# Patient Record
Sex: Female | Born: 2005 | Race: White | Hispanic: No | Marital: Single | Smoking: Never smoker
Health system: Southern US, Community
[De-identification: ages and names within clinical notes are randomized; demographics above are authoritative.]

## PROBLEM LIST (undated history)

## (undated) DIAGNOSIS — N3020 Other chronic cystitis without hematuria: Secondary | ICD-10-CM

---
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
## 2024-07-28 ENCOUNTER — Emergency Department (HOSPITAL_COMMUNITY)

## 2024-07-28 ENCOUNTER — Encounter (HOSPITAL_COMMUNITY): Payer: Self-pay | Admitting: Emergency Medicine

## 2024-07-28 ENCOUNTER — Inpatient Hospital Stay (HOSPITAL_COMMUNITY)
Admission: EM | Admit: 2024-07-28 | Discharge: 2024-07-30 | DRG: 872 | Disposition: A | Discharge status: Home / Self Care | Attending: Internal Medicine | Admitting: Internal Medicine

## 2024-07-28 ENCOUNTER — Other Ambulatory Visit: Payer: Self-pay

## 2024-07-28 DIAGNOSIS — E871 Hypo-osmolality and hyponatremia: Secondary | ICD-10-CM | POA: Diagnosis present

## 2024-07-28 DIAGNOSIS — L0390 Cellulitis, unspecified: Secondary | ICD-10-CM | POA: Diagnosis present

## 2024-07-28 DIAGNOSIS — Z68.41 Body mass index (BMI) pediatric, greater than or equal to 95th percentile for age: Secondary | ICD-10-CM

## 2024-07-28 DIAGNOSIS — E876 Hypokalemia: Secondary | ICD-10-CM | POA: Diagnosis present

## 2024-07-28 DIAGNOSIS — Z91040 Latex allergy status: Secondary | ICD-10-CM

## 2024-07-28 DIAGNOSIS — K219 Gastro-esophageal reflux disease without esophagitis: Secondary | ICD-10-CM | POA: Diagnosis present

## 2024-07-28 DIAGNOSIS — L732 Hidradenitis suppurativa: Secondary | ICD-10-CM | POA: Diagnosis present

## 2024-07-28 DIAGNOSIS — Z1152 Encounter for screening for COVID-19: Secondary | ICD-10-CM

## 2024-07-28 DIAGNOSIS — A419 Sepsis, unspecified organism: Principal | ICD-10-CM | POA: Diagnosis present

## 2024-07-28 DIAGNOSIS — L03112 Cellulitis of left axilla: Secondary | ICD-10-CM | POA: Diagnosis present

## 2024-07-28 DIAGNOSIS — E66811 Obesity, class 1: Secondary | ICD-10-CM | POA: Diagnosis present

## 2024-07-28 LAB — CBC WITH DIFFERENTIAL/PLATELET
Abs Immature Granulocytes: 0.07 K/uL (ref 0.00–0.07)
Basophils Absolute: 0.1 K/uL (ref 0.0–0.1)
Basophils Relative: 0 %
Eosinophils Absolute: 0 K/uL (ref 0.0–0.5)
Eosinophils Relative: 0 %
HCT: 42 % (ref 36.0–46.0)
Hemoglobin: 14.3 g/dL (ref 12.0–15.0)
Immature Granulocytes: 0 %
Lymphocytes Relative: 9 %
Lymphs Abs: 1.8 K/uL (ref 0.7–4.0)
MCH: 28.7 pg (ref 26.0–34.0)
MCHC: 34 g/dL (ref 30.0–36.0)
MCV: 84.2 fL (ref 80.0–100.0)
Monocytes Absolute: 1.9 K/uL — ABNORMAL HIGH (ref 0.1–1.0)
Monocytes Relative: 10 %
Neutro Abs: 15.8 K/uL — ABNORMAL HIGH (ref 1.7–7.7)
Neutrophils Relative %: 81 %
Platelets: 314 K/uL (ref 150–400)
RBC: 4.99 MIL/uL (ref 3.87–5.11)
RDW: 13.1 % (ref 11.5–15.5)
WBC: 19.6 K/uL — ABNORMAL HIGH (ref 4.0–10.5)
nRBC: 0 % (ref 0.0–0.2)

## 2024-07-28 LAB — COMPREHENSIVE METABOLIC PANEL WITH GFR
ALT: 15 U/L (ref 0–44)
AST: 18 U/L (ref 15–41)
Albumin: 4.2 g/dL (ref 3.5–5.0)
Alkaline Phosphatase: 100 U/L (ref 38–126)
Anion gap: 12 (ref 5–15)
BUN: 8 mg/dL (ref 6–20)
CO2: 20 mmol/L — ABNORMAL LOW (ref 22–32)
Calcium: 9.5 mg/dL (ref 8.9–10.3)
Chloride: 102 mmol/L (ref 98–111)
Creatinine, Ser: 0.7 mg/dL (ref 0.44–1.00)
GFR, Estimated: >60 mL/min (ref >60)
Glucose, Bld: 104 mg/dL — ABNORMAL HIGH (ref 70–99)
Potassium: 3.3 mmol/L — ABNORMAL LOW (ref 3.5–5.1)
Sodium: 134 mmol/L — ABNORMAL LOW (ref 135–145)
Total Bilirubin: 0.7 mg/dL (ref 0.0–1.2)
Total Protein: 8 g/dL (ref 6.5–8.1)

## 2024-07-28 LAB — POC URINE PREG, ED: Preg Test, Ur: NEGATIVE

## 2024-07-28 LAB — PROTIME-INR
INR: 1 (ref 0.8–1.2)
Prothrombin Time: 14.1 s (ref 11.4–15.2)

## 2024-07-28 LAB — RESP PANEL BY RT-PCR (RSV, FLU A&B, COVID)  RVPGX2
Influenza A by PCR: NEGATIVE
Influenza B by PCR: NEGATIVE
Resp Syncytial Virus by PCR: NEGATIVE
SARS Coronavirus 2 by RT PCR: NEGATIVE

## 2024-07-28 LAB — LACTIC ACID, PLASMA
Lactic Acid, Venous: 1.8 mmol/L (ref 0.5–1.9)
Lactic Acid, Venous: 1.2 mmol/L (ref 0.5–1.9)

## 2024-07-28 MED ORDER — LACTATED RINGERS IV SOLN: INTRAVENOUS | Status: DC

## 2024-07-28 MED ORDER — LACTATED RINGERS IV BOLUS (SEPSIS)
1000.0000 mL | Freq: Once | INTRAVENOUS | Status: AC
  Administered 2024-07-28: 1000 mL via INTRAVENOUS

## 2024-07-28 MED ORDER — SENNOSIDES-DOCUSATE SODIUM 8.6-50 MG PO TABS: 1.0000 | ORAL_TABLET | Freq: Every evening | ORAL | Status: DC | PRN

## 2024-07-28 MED ORDER — HYDROCODONE-ACETAMINOPHEN 5-325 MG PO TABS
1.0000 | ORAL_TABLET | ORAL | Status: DC | PRN
  Administered 2024-07-29 – 2024-07-30 (×6): 1 via ORAL
  Filled 2024-07-28 (×6): qty 1

## 2024-07-28 MED ORDER — ACETAMINOPHEN 500 MG PO TABS
1000.0000 mg | ORAL_TABLET | Freq: Once | ORAL | Status: AC
  Administered 2024-07-28: 1000 mg via ORAL
  Filled 2024-07-28: qty 2

## 2024-07-28 MED ORDER — SODIUM CHLORIDE 0.9 % IV SOLN
2.0000 g | Freq: Once | INTRAVENOUS | Status: AC
  Administered 2024-07-28: 2 g via INTRAVENOUS
  Filled 2024-07-28: qty 20

## 2024-07-28 MED ORDER — IOHEXOL 300 MG/ML  SOLN
75.0000 mL | Freq: Once | INTRAMUSCULAR | Status: AC | PRN
  Administered 2024-07-28: 75 mL via INTRAVENOUS

## 2024-07-28 MED ORDER — CEFAZOLIN SODIUM-DEXTROSE 2-4 GM/100ML-% IV SOLN
2.0000 g | Freq: Three times a day (TID) | INTRAVENOUS | Status: DC
  Administered 2024-07-29 – 2024-07-30 (×4): 2 g via INTRAVENOUS
  Filled 2024-07-28 (×4): qty 100

## 2024-07-28 MED ORDER — HYDROMORPHONE HCL 1 MG/ML IJ SOLN
0.5000 mg | INTRAMUSCULAR | Status: DC | PRN
  Administered 2024-07-29: 0.5 mg via INTRAVENOUS
  Filled 2024-07-28: qty 0.5

## 2024-07-28 MED ORDER — ONDANSETRON HCL 4 MG PO TABS
4.0000 mg | ORAL_TABLET | Freq: Four times a day (QID) | ORAL | Status: DC | PRN
  Administered 2024-07-29 (×2): 4 mg via ORAL
  Filled 2024-07-28 (×2): qty 1

## 2024-07-28 MED ORDER — ENOXAPARIN SODIUM 40 MG/0.4ML IJ SOSY
40.0000 mg | PREFILLED_SYRINGE | INTRAMUSCULAR | Status: DC
  Filled 2024-07-28 (×2): qty 0.4

## 2024-07-28 MED ORDER — ACETAMINOPHEN 650 MG RE SUPP: 650.0000 mg | Freq: Four times a day (QID) | RECTAL | Status: DC | PRN

## 2024-07-28 MED ORDER — ACETAMINOPHEN 325 MG PO TABS: 650.0000 mg | ORAL_TABLET | Freq: Four times a day (QID) | ORAL | Status: DC | PRN

## 2024-07-28 MED ORDER — MORPHINE SULFATE (PF) 4 MG/ML IV SOLN
4.0000 mg | Freq: Once | INTRAVENOUS | Status: AC
  Administered 2024-07-28: 4 mg via INTRAVENOUS
  Filled 2024-07-28: qty 1

## 2024-07-28 MED ORDER — ONDANSETRON HCL 4 MG/2ML IJ SOLN
4.0000 mg | Freq: Four times a day (QID) | INTRAMUSCULAR | Status: DC | PRN
Start: 2024-07-28 — End: 2024-07-30
  Administered 2024-07-29 – 2024-07-30 (×3): 4 mg via INTRAVENOUS
  Filled 2024-07-28 (×3): qty 2

## 2024-07-28 NOTE — H&P (Signed)
 PCP:   Marvine Rush, MD   Chief Complaint:  Left arm pain  HPI:  This is a healthy 18 year old female.  The yesterday she noticed a boil under her left armpit.  She popped it with a needle yesterday.  This morning she woke she was unable to move her left arm secondary to pain under her arm.  She fevers and chills all day.  She has had no prior skin infections.  She came to the ER  Presenting vitals 142/102, 139, Tmax 99.8 Labs: Sodium 134, potassium 3.3, WBC 19.6.  Blood cultures x 2 CT chest: Inflammatory change in the left axilla.  No organized fluid collection to suggest abscess.    Review of Systems:  The patient denies anorexia, weight loss,, vision loss, decreased hearing, hoarseness, chest pain, syncope, dyspnea on exertion, peripheral edema, balance deficits, hemoptysis, abdominal pain, melena, hematochezia, severe indigestion/heartburn, hematuria, incontinence, genital sores, muscle weakness, suspicious skin lesions, transient blindness, difficulty walking, depression, unusual weight change, abnormal bleeding, enlarged lymph nodes, angioedema, and breast masses. Positive: Fever, left underarm pain  Past Medical History: Past Medical History:  Diagnosis Date   Bladder infection, chronic    History reviewed. No pertinent surgical history.  Medications: Prior to Admission medications   Medication Sig Start Date End Date Taking? Authorizing Provider  acetaminophen  (TYLENOL ) 500 MG tablet Take 1,000 mg by mouth every 6 (six) hours as needed for mild pain (pain score 1-3) or headache.   Yes [provider]  clobetasol ointment (TEMOVATE) 0.05 % Apply 1 Application topically 2 (two) times daily. 07/03/24  Yes [provider]  diphenhydrAMINE -APAP, sleep, (GOODY PM PO) Take 1 Package by mouth at bedtime as needed (pain).   Yes [provider]  SETLAKIN 0.15-0.03 MG tablet Take 1 tablet by mouth daily. 11/14/22   [provider]    Allergies:    Allergies  Allergen Reactions   Latex Hives and Dermatitis   Mosquito (Diagnostic) Swelling and Other (See Comments)    Each bite swells into one and then causes entire extremity to swell    Social History:  reports that she has never smoked. She has never used smokeless tobacco. She reports that she does not drink alcohol and does not use drugs.  Family History: Family History  Problem Relation Age of Onset   Anxiety disorder Mother        Past hx of anxiety and was taking medication for it-resolved   Migraines Mother    ADD / ADHD Father    Autism Cousin        3 paternal 2nd cousin's have autism   Autism Other     Physical Exam: Vitals:   07/28/24 2030 07/28/24 2100 07/28/24 2115 07/28/24 2130  BP: (!) 110/56   (!) 103/57  Pulse: (!) 107 (!) 115 (!) 110 (!) 111  Resp: (!) 21 18 20 20   Temp: 98.6 F (37 C)   98.7 F (37.1 C)  TempSrc: Oral   Oral  SpO2: 98% 95% 93% 96%  Weight:      Height:        General:  A&Ox3, well developed and nourished, no acute distress Eyes: PERRL, pink conjunctiva, no scleral icterus ENT: Moist oral mucosa, neck supple, no thyromegaly Lungs: CTAB/L, no wheeze, no crackles, no use of accessory muscles Cardiovascular: RRR, no murmurs.  Abdomen: soft, positive BS, NTND  GU: not examined Neuro: CN II - XII grossly intact, sensation intact Musculoskeletal: strength 5/5 all extremities, no clubbing or  edema Skin: Erythematous, TTP on the left arm.  No fluctuance or drainage noted Psych: appropriate patient  Labs on Admission:  Recent Labs    07/28/24 1919  NA 134*  K 3.3*  CL 102  CO2 20*  GLUCOSE 104*  BUN 8  CREATININE 0.70  CALCIUM 9.5   Recent Labs    07/28/24 1919  AST 18  ALT 15  ALKPHOS 100  BILITOT 0.7  PROT 8.0  ALBUMIN 4.2    Recent Labs    07/28/24 1919  WBC 19.6*  NEUTROABS 15.8*  HGB 14.3  HCT 42.0  MCV 84.2  PLT 314    Micro Results: Recent Results (from the past 240 hours)  Blood Culture  (routine x 2)     Status: None (Preliminary result)   Collection Time: 07/28/24  7:19 PM   Specimen: Left Antecubital; Blood  Result Value Ref Range Status   Specimen Description LEFT ANTECUBITAL  Final   Special Requests   Final    BOTTLES DRAWN AEROBIC AND ANAEROBIC Blood Culture adequate volume Performed at [facility], [address] [place], [place] [address]    Culture PENDING  Incomplete   Report Status PENDING  Incomplete  Blood Culture (routine x 2)     Status: None (Preliminary result)   Collection Time: 07/28/24  7:24 PM   Specimen: Right Antecubital; Blood  Result Value Ref Range Status   Specimen Description RIGHT ANTECUBITAL  Final   Special Requests   Final    BOTTLES DRAWN AEROBIC AND ANAEROBIC Blood Culture adequate volume Performed at [facility], [address], [place], [place] [address]    Culture PENDING  Incomplete   Report Status PENDING  Incomplete  Resp panel by RT-PCR (RSV, Flu A&B, Covid) Anterior Nasal Swab     Status: None   Collection Time: 07/28/24  7:40 PM   Specimen: Anterior Nasal Swab  Result Value Ref Range Status   SARS Coronavirus 2 by RT PCR NEGATIVE NEGATIVE Final    Comment: (NOTE) SARS-CoV-2 target nucleic acids are NOT DETECTED.  The SARS-CoV-2 RNA is generally detectable in upper respiratory specimens during the acute phase of infection. The lowest concentration of SARS-CoV-2 viral copies this assay can detect is 138 copies/mL. A negative result does not preclude SARS-Cov-2 infection and should not be used as the sole basis for treatment or other patient management decisions. A negative result may occur with  improper specimen collection/handling, submission of specimen other than nasopharyngeal swab, presence of viral mutation(s) within the areas targeted by this assay, and inadequate number of viral copies(<138 copies/mL). A negative result must be combined with clinical observations, patient history, and  epidemiological information. The expected result is Negative.  Fact Sheet for Patients:  BloggerCourse.com  Fact Sheet for Healthcare Providers:  SeriousBroker.it  This test is no t yet approved or cleared by the [place]  FDA and  has been authorized for detection and/or diagnosis of SARS-CoV-2 by FDA under an Emergency Use Authorization (EUA). This EUA will remain  in effect (meaning this test can be used) for the duration of the COVID-19 declaration under Section 564(b)(1) of the Act, 21 U.S.C.section 360bbb-3(b)(1), unless the authorization is terminated  or revoked sooner.       Influenza A by PCR NEGATIVE NEGATIVE Final   Influenza B by PCR NEGATIVE NEGATIVE Final    Comment: (NOTE) The Xpert Xpress SARS-CoV-2/FLU/RSV plus assay is intended as an aid in the diagnosis of influenza from Nasopharyngeal swab specimens and should  not be used as a sole basis for treatment. Nasal washings and aspirates are unacceptable for Xpert Xpress SARS-CoV-2/FLU/RSV testing.  Fact Sheet for Patients: BloggerCourse.com  Fact Sheet for Healthcare Providers: SeriousBroker.it  This test is not yet approved or cleared by the [place]  FDA and has been authorized for detection and/or diagnosis of SARS-CoV-2 by FDA under an Emergency Use Authorization (EUA). This EUA will remain in effect (meaning this test can be used) for the duration of the COVID-19 declaration under Section 564(b)(1) of the Act, 21 U.S.C. section 360bbb-3(b)(1), unless the authorization is terminated or revoked.     Resp Syncytial Virus by PCR NEGATIVE NEGATIVE Final    Comment: (NOTE) Fact Sheet for Patients: BloggerCourse.com  Fact Sheet for Healthcare Providers: SeriousBroker.it  This test is not yet approved or cleared by the [place]  FDA and has been  authorized for detection and/or diagnosis of SARS-CoV-2 by FDA under an Emergency Use Authorization (EUA). This EUA will remain in effect (meaning this test can be used) for the duration of the COVID-19 declaration under Section 564(b)(1) of the Act, 21 U.S.C. section 360bbb-3(b)(1), unless the authorization is terminated or revoked.  Performed at [facility], [address], [place], [place] [address]      Radiological Exams on Admission: CT Chest W Contrast Result Date: 07/28/2024 CLINICAL DATA:  Abscess of the axilla. LEFT axillary abscess. Fever. EXAM: CT CHEST WITH CONTRAST TECHNIQUE: Multidetector CT imaging of the chest was performed during intravenous contrast administration. RADIATION DOSE REDUCTION: This exam was performed according to the departmental dose-optimization program which includes automated exposure control, adjustment of the mA and/or kV according to patient size and/or use of iterative reconstruction technique. CONTRAST:  75mL OMNIPAQUE  IOHEXOL  300 MG/ML  SOLN COMPARISON:  None Available. FINDINGS: Cardiovascular: No significant vascular findings. Normal heart size. No pericardial effusion. Mediastinum/Nodes: No axillary or supraclavicular adenopathy. No mediastinal or hilar adenopathy. No pericardial fluid. Esophagus normal. Small amount residual thymus in the anterior mediastinum. There is fat stranding in the LEFT axilla. No organized fluid collections. Prominent lymph nodes upper limits of normal. Lungs/Pleura: No pneumonia. No pulmonary nodules. No pleural fluid. Upper Abdomen: Limited view of the liver, kidneys, pancreas are unremarkable. Normal adrenal glands. Musculoskeletal: No aggressive osseous lesion. IMPRESSION: 1. Inflammatory changes within the LEFT axilla. No organized fluid collections to suggest abscess. 2. Prominent lymph nodes in LEFT axilla presumed reactive. 3. No pneumonia. Electronically Signed   By: Jackquline Boxer M.D.   On: 07/28/2024 21:37   DG  Chest Port 1 View Result Date: 07/28/2024 CLINICAL DATA:  Questionable sepsis, evaluate for abnormality EXAM: PORTABLE CHEST 1 VIEW COMPARISON:  Chest radiograph June 02, 2019 FINDINGS: The heart size and mediastinal contours are within normal limits. Both lungs are clear. The visualized skeletal structures are unremarkable. IMPRESSION: No active disease. Electronically Signed   By: Megan  Zare M.D.   On: 07/28/2024 20:06    Assessment/Plan Present on Admission:  Left underarm cellulitis -Cellulitis order set initiated.  IV Ancef .  MRSA PCR.  Pain meds as needed   Hyponatremia/hypokalemia - IV fluid hydration with potassium.  BMP in a.m.  Cedric Denison 07/28/2024, 10:37 PM

## 2024-07-28 NOTE — ED Triage Notes (Signed)
 Pt to the ED with a boil under her left arm.

## 2024-07-28 NOTE — ED Provider Notes (Signed)
 [place] EMERGENCY DEPARTMENT AT [facility] Provider Note   CSN: [id number] Arrival date & time: 07/28/24  1800     Patient presents with: Recurrent Skin Infections   Patricia Todd is a 18 y.o. female.   HPI Patient with swelling and pain of her left axilla.  Patient was worried for infection.  States it was swollen and she put a needle in it and felt a pop.  However since then has felt worse.  Has fevers and pain.  Felt lightheaded.    Prior to Admission medications   Medication Sig Start Date End Date Taking? Authorizing Provider  acetaminophen  (TYLENOL ) 500 MG tablet Take 1,000 mg by mouth every 6 (six) hours as needed for mild pain (pain score 1-3) or headache.   Yes [provider]  clobetasol ointment (TEMOVATE) 0.05 % Apply 1 Application topically 2 (two) times daily. 07/03/24  Yes [provider]  diphenhydrAMINE -APAP, sleep, (GOODY PM PO) Take 1 Package by mouth at bedtime as needed (pain).   Yes [provider]  SETLAKIN 0.15-0.03 MG tablet Take 1 tablet by mouth daily. 11/14/22   [provider]    Allergies: Latex and Mosquito (diagnostic)    Review of Systems  Updated Vital Signs BP (!) 103/57 (BP Location: Right Arm)   Pulse (!) 111   Temp 98.7 F (37.1 C) (Oral)   Resp 20   Ht 5' 4 (1.626 m)   Wt 83 kg   LMP 07/13/2024 (Approximate)   SpO2 96%   BMI 31.41 kg/m   Physical Exam Vitals and nursing note reviewed.  HENT:     Head: Normocephalic.  Cardiovascular:     Rate and Rhythm: Tachycardia present.  Pulmonary:     Breath sounds: No wheezing or rhonchi.  Skin:    Comments: Tenderness and induration to left axilla.  Central induration.  No clear fluctuance.  Neurological:     Mental Status: She is alert.     (all labs ordered are listed, but only abnormal results are displayed) Labs Reviewed  COMPREHENSIVE METABOLIC PANEL WITH GFR - Abnormal; Notable for the following components:      Result  Value   Sodium 134 (*)    Potassium 3.3 (*)    CO2 20 (*)    Glucose, Bld 104 (*)    All other components within normal limits  CBC WITH DIFFERENTIAL/PLATELET - Abnormal; Notable for the following components:   WBC 19.6 (*)    Neutro Abs 15.8 (*)    Monocytes Absolute 1.9 (*)    All other components within normal limits  POC URINE PREG, ED - Normal  RESP PANEL BY RT-PCR (RSV, FLU A&B, COVID)  RVPGX2  CULTURE, BLOOD (ROUTINE X 2)  CULTURE, BLOOD (ROUTINE X 2)  LACTIC ACID, PLASMA  LACTIC ACID, PLASMA  PROTIME-INR  POC URINE PREG, ED    EKG: None  Radiology: CT Chest W Contrast Result Date: 07/28/2024 CLINICAL DATA:  Abscess of the axilla. LEFT axillary abscess. Fever. EXAM: CT CHEST WITH CONTRAST TECHNIQUE: Multidetector CT imaging of the chest was performed during intravenous contrast administration. RADIATION DOSE REDUCTION: This exam was performed according to the departmental dose-optimization program which includes automated exposure control, adjustment of the mA and/or kV according to patient size and/or use of iterative reconstruction technique. CONTRAST:  75mL OMNIPAQUE  IOHEXOL  300 MG/ML  SOLN COMPARISON:  None Available. FINDINGS: Cardiovascular: No significant vascular findings. Normal heart size. No pericardial effusion. Mediastinum/Nodes: No axillary or supraclavicular adenopathy. No mediastinal  or hilar adenopathy. No pericardial fluid. Esophagus normal. Small amount residual thymus in the anterior mediastinum. There is fat stranding in the LEFT axilla. No organized fluid collections. Prominent lymph nodes upper limits of normal. Lungs/Pleura: No pneumonia. No pulmonary nodules. No pleural fluid. Upper Abdomen: Limited view of the liver, kidneys, pancreas are unremarkable. Normal adrenal glands. Musculoskeletal: No aggressive osseous lesion. IMPRESSION: 1. Inflammatory changes within the LEFT axilla. No organized fluid collections to suggest abscess. 2. Prominent lymph nodes in  LEFT axilla presumed reactive. 3. No pneumonia. Electronically Signed   By: Jackquline Boxer M.D.   On: 07/28/2024 21:37   DG Chest Port 1 View Result Date: 07/28/2024 CLINICAL DATA:  Questionable sepsis, evaluate for abnormality EXAM: PORTABLE CHEST 1 VIEW COMPARISON:  Chest radiograph June 02, 2019 FINDINGS: The heart size and mediastinal contours are within normal limits. Both lungs are clear. The visualized skeletal structures are unremarkable. IMPRESSION: No active disease. Electronically Signed   By: Megan  Zare M.D.   On: 07/28/2024 20:06     Procedures   Medications Ordered in the ED  lactated ringers  infusion ( Intravenous New Bag/Given 07/28/24 1939)  cefTRIAXone  (ROCEPHIN ) 2 g in sodium chloride  0.9 % 100 mL IVPB (has no administration in time range)  lactated ringers  bolus 1,000 mL (0 mLs Intravenous Stopped 07/28/24 2100)    And  lactated ringers  bolus 1,000 mL (0 mLs Intravenous Stopped 07/28/24 2050)    And  lactated ringers  bolus 1,000 mL (1,000 mLs Intravenous New Bag/Given 07/28/24 2030)  morphine  (PF) 4 MG/ML injection 4 mg (4 mg Intravenous Given 07/28/24 1937)  acetaminophen  (TYLENOL ) tablet 1,000 mg (1,000 mg Oral Given 07/28/24 1935)  iohexol  (OMNIPAQUE ) 300 MG/ML solution 75 mL (75 mLs Intravenous Contrast Given 07/28/24 2045)                                    Medical Decision Making Amount and/or Complexity of Data Reviewed Radiology: ordered.  Risk Prescription drug management.   Patient with pain and swelling of the left axilla.  Differential diagnosis includes cellulitis and abscess.  Also after patient put needle and at home felt worse and causes such as bacteremia considered.  CT scan done due to the systemic symptoms.  Does not show clear purulence.  White count is elevated.  With continued symptoms will admit to hospitalist.       Final diagnoses:  Cellulitis of left axilla    ED Discharge Orders     None          Patsey Lot,  MD 07/28/24 2214

## 2024-07-28 NOTE — ED Provider Triage Note (Signed)
 Emergency Medicine Provider Triage Evaluation Note  Patricia Todd , a 18 y.o. female  was evaluated in triage.  Pt complains of abscess to left axilla with fevers.  Patient noticed swelling and started the left arm several days ago, she asked her friend to help her pop out last night, she states that he did a needle with a lighter and stuck it in the swollen area.  There is no drainage but she felt something pop inside.  And since then has been having fevers, severe pain.  She has never had this in the past.  Review of Systems  Positive: Fever, swelling left axilla Negative: Vomiting  Physical Exam  BP (!) 143/102 (BP Location: Right Arm)   Pulse (!) 139   Temp 99.8 F (37.7 C) (Oral)   Resp 17   Ht 5' 4 (1.626 m)   Wt 83 kg   LMP 07/13/2024 (Approximate)   SpO2 100%   BMI 31.41 kg/m  Gen:   Awake, no distress   Resp:  Normal effort  MSK:   Moves extremities without difficulty  Other:    Medical Decision Making  Medically screening exam initiated at 6:46 PM.  Appropriate orders placed.  Patricia Todd was informed that the remainder of the evaluation will be completed by another provider, this initial triage assessment does not replace that evaluation, and the importance of remaining in the ED until their evaluation is complete.     Patricia Todd LABOR, [place] 07/28/24 [phone]

## 2024-07-29 DIAGNOSIS — E66811 Obesity, class 1: Secondary | ICD-10-CM | POA: Diagnosis not present

## 2024-07-29 DIAGNOSIS — E876 Hypokalemia: Secondary | ICD-10-CM | POA: Diagnosis not present

## 2024-07-29 DIAGNOSIS — A419 Sepsis, unspecified organism: Secondary | ICD-10-CM

## 2024-07-29 DIAGNOSIS — L0390 Cellulitis, unspecified: Secondary | ICD-10-CM | POA: Diagnosis not present

## 2024-07-29 DIAGNOSIS — L03112 Cellulitis of left axilla: Secondary | ICD-10-CM | POA: Diagnosis not present

## 2024-07-29 LAB — BASIC METABOLIC PANEL WITH GFR
Anion gap: 8 (ref 5–15)
BUN: 5 mg/dL — ABNORMAL LOW (ref 6–20)
CO2: 24 mmol/L (ref 22–32)
Calcium: 8.1 mg/dL — ABNORMAL LOW (ref 8.9–10.3)
Chloride: 102 mmol/L (ref 98–111)
Creatinine, Ser: 0.65 mg/dL (ref 0.44–1.00)
GFR, Estimated: >60 mL/min (ref >60)
Glucose, Bld: 102 mg/dL — ABNORMAL HIGH (ref 70–99)
Potassium: 3.4 mmol/L — ABNORMAL LOW (ref 3.5–5.1)
Sodium: 134 mmol/L — ABNORMAL LOW (ref 135–145)

## 2024-07-29 LAB — CBC WITH DIFFERENTIAL/PLATELET
Abs Immature Granulocytes: 0.09 K/uL — ABNORMAL HIGH (ref 0.00–0.07)
Basophils Absolute: 0.1 K/uL (ref 0.0–0.1)
Basophils Relative: 0 %
Eosinophils Absolute: 0.1 K/uL (ref 0.0–0.5)
Eosinophils Relative: 0 %
HCT: 37.7 % (ref 36.0–46.0)
Hemoglobin: 12.4 g/dL (ref 12.0–15.0)
Immature Granulocytes: 1 %
Lymphocytes Relative: 14 %
Lymphs Abs: 2.6 K/uL (ref 0.7–4.0)
MCH: 27.9 pg (ref 26.0–34.0)
MCHC: 32.9 g/dL (ref 30.0–36.0)
MCV: 84.7 fL (ref 80.0–100.0)
Monocytes Absolute: 2.4 K/uL — ABNORMAL HIGH (ref 0.1–1.0)
Monocytes Relative: 13 %
Neutro Abs: 13.1 K/uL — ABNORMAL HIGH (ref 1.7–7.7)
Neutrophils Relative %: 72 %
Platelets: 266 K/uL (ref 150–400)
RBC: 4.45 MIL/uL (ref 3.87–5.11)
RDW: 13.1 % (ref 11.5–15.5)
WBC: 18.2 K/uL — ABNORMAL HIGH (ref 4.0–10.5)
nRBC: 0 % (ref 0.0–0.2)

## 2024-07-29 MED ORDER — POTASSIUM CHLORIDE CRYS ER 20 MEQ PO TBCR
40.0000 meq | EXTENDED_RELEASE_TABLET | Freq: Once | ORAL | Status: AC
  Administered 2024-07-29: 40 meq via ORAL
  Filled 2024-07-29: qty 2

## 2024-07-29 MED ORDER — PROCHLORPERAZINE EDISYLATE 10 MG/2ML IJ SOLN
10.0000 mg | Freq: Four times a day (QID) | INTRAMUSCULAR | Status: DC | PRN
  Administered 2024-07-29: 10 mg via INTRAVENOUS
  Filled 2024-07-29: qty 2

## 2024-07-29 MED ORDER — HYDROMORPHONE HCL 1 MG/ML IJ SOLN: 0.5000 mg | Freq: Four times a day (QID) | INTRAMUSCULAR | Status: DC | PRN

## 2024-07-29 MED ORDER — PANTOPRAZOLE SODIUM 40 MG PO TBEC
40.0000 mg | DELAYED_RELEASE_TABLET | Freq: Two times a day (BID) | ORAL | Status: DC
  Administered 2024-07-29 – 2024-07-30 (×2): 40 mg via ORAL
  Filled 2024-07-29 (×2): qty 1

## 2024-07-29 MED ORDER — IBUPROFEN 600 MG PO TABS
600.0000 mg | ORAL_TABLET | Freq: Three times a day (TID) | ORAL | Status: DC
  Administered 2024-07-29 – 2024-07-30 (×4): 600 mg via ORAL
  Filled 2024-07-29: qty 2
  Filled 2024-07-29 (×3): qty 1

## 2024-07-29 MED ORDER — PANTOPRAZOLE SODIUM 40 MG PO TBEC
40.0000 mg | DELAYED_RELEASE_TABLET | Freq: Every day | ORAL | Status: DC
  Administered 2024-07-29: 40 mg via ORAL
  Filled 2024-07-29: qty 1

## 2024-07-29 MED ORDER — POTASSIUM CHLORIDE IN NACL 20-0.9 MEQ/L-% IV SOLN
INTRAVENOUS | Status: DC
  Administered 2024-07-29: 1000 mL via INTRAVENOUS
  Filled 2024-07-29: qty 1000

## 2024-07-29 MED ORDER — SODIUM CHLORIDE 0.9 % IV SOLN: INTRAVENOUS | Status: AC

## 2024-07-29 NOTE — ED Notes (Addendum)
 Meal tray given to patient.

## 2024-07-29 NOTE — ED Notes (Signed)
 Pt remains pain and nausea free at this time. No needs.

## 2024-07-29 NOTE — Progress Notes (Signed)
  Progress Note   Patient: Patricia Todd FMW:[id number] DOB: [birth year] DOA: 07/28/2024     1 DOS: the patient was seen and examined on 07/29/2024   Brief hospital admission narrative: As per H&P written by Dr. Laveda on 07/28/2024 This is a healthy 18 year old female.  The yesterday she noticed a boil under her left armpit.  She popped it with a needle yesterday.  This morning she woke she was unable to move her left arm secondary to pain under her arm.  She fevers and chills all day.  She has had no prior skin infections.  She came to the ER   Presenting vitals 142/102, 139, Tmax 99.8 Labs: Sodium 134, potassium 3.3, WBC 19.6.  Blood cultures x 2 CT chest: Inflammatory change in the left axilla.  No organized fluid collection to suggest abscess.    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 -Continue IV antibiotics, IV antibiotics, warm compresses and schedule anti-inflammatory drugs. - Will continue as needed analgesics and the use of as needed antipyretics. -Continue supportive care and follow clinical response.  2-GI prophylaxis - Continue PPI.  3-hypokalemia - Will check magnesium level - Replete electrolytes as needed and follow trend.  4-class I obesity -Body mass index is 31.41 kg/m. -Increased physical activity, low calorie diet and portion control discussed with patient  Subjective:  Currently afebrile; reports no nausea or vomiting.  Complaining of significant pain in her left armpit.  Physical Exam: Vitals:   07/29/24 0815 07/29/24 0816 07/29/24 0825 07/29/24 0900  BP: 114/75   110/61  Pulse: (!) 107 99 96   Resp: (!) 23 17  18   Temp:    98.2 F (36.8 C)  TempSrc:    Oral  SpO2: 97% 98% 94%   Weight:      Height:       General exam: Alert,  awake, oriented x 3; currently afebrile; complaining of significant pain in her left armpit. Respiratory system: Clear to auscultation. Respiratory effort normal.  Good saturation on room air. Cardiovascular system: Sinus tachycardia. No murmurs, rubs, gallops. Gastrointestinal system: Abdomen is nondistended, soft and nontender. No organomegaly or masses felt. Normal bowel sounds heard. Central nervous system: No focal neurological deficits. Extremities: No C/C/E, +pedal pulses Skin: Left armpit with erythematous changes, warm sensation on palpation, induration without fluctuation and tenderness. Psychiatry: Judgement and insight appear normal. Mood & affect appropriate.    Data Reviewed: Basic metabolic panel: Sodium 134, potassium 3.4, chloride 102, bicarb 24, BUN 5, creatinine 0.65 and GFR >60 CBC: WBCs 18.2, hemoglobin 12.4 and platelet count 266K  Family Communication: No family at bedside.  Disposition: Status is: Inpatient Remains inpatient appropriate because: Continue IV antibiotics.  Anticipating discharge back home once medically stable.  Time spent: 50 minutes  Author: Eric Nunnery, MD 07/29/2024 9:22 AM  For on call review [email or web address].

## 2024-07-29 NOTE — ED Notes (Signed)
 Pt ambulated to restroom.

## 2024-07-29 NOTE — Plan of Care (Signed)

## 2024-07-30 DIAGNOSIS — E66811 Obesity, class 1: Secondary | ICD-10-CM

## 2024-07-30 DIAGNOSIS — A419 Sepsis, unspecified organism: Secondary | ICD-10-CM

## 2024-07-30 DIAGNOSIS — L03112 Cellulitis of left axilla: Secondary | ICD-10-CM | POA: Diagnosis not present

## 2024-07-30 LAB — BASIC METABOLIC PANEL WITH GFR
Anion gap: 5 (ref 5–15)
BUN: 5 mg/dL — ABNORMAL LOW (ref 6–20)
CO2: 22 mmol/L (ref 22–32)
Calcium: 8.1 mg/dL — ABNORMAL LOW (ref 8.9–10.3)
Chloride: 109 mmol/L (ref 98–111)
Creatinine, Ser: 0.57 mg/dL (ref 0.44–1.00)
GFR, Estimated: >60 mL/min (ref >60)
Glucose, Bld: 85 mg/dL (ref 70–99)
Potassium: 4 mmol/L (ref 3.5–5.1)
Sodium: 136 mmol/L (ref 135–145)

## 2024-07-30 LAB — CBC
HCT: 34.8 % — ABNORMAL LOW (ref 36.0–46.0)
Hemoglobin: 11.1 g/dL — ABNORMAL LOW (ref 12.0–15.0)
MCH: 27.5 pg (ref 26.0–34.0)
MCHC: 31.9 g/dL (ref 30.0–36.0)
MCV: 86.4 fL (ref 80.0–100.0)
Platelets: 224 K/uL (ref 150–400)
RBC: 4.03 MIL/uL (ref 3.87–5.11)
RDW: 13.4 % (ref 11.5–15.5)
WBC: 14.3 K/uL — ABNORMAL HIGH (ref 4.0–10.5)
nRBC: 0 % (ref 0.0–0.2)

## 2024-07-30 LAB — MAGNESIUM: Magnesium: 1.6 mg/dL — ABNORMAL LOW (ref 1.7–2.4)

## 2024-07-30 MED ORDER — PANTOPRAZOLE SODIUM 40 MG PO TBEC: 40.0000 mg | DELAYED_RELEASE_TABLET | Freq: Two times a day (BID) | ORAL | 0 refills | Status: DC

## 2024-07-30 MED ORDER — OXYCODONE HCL 5 MG PO TABS: 5.0000 mg | ORAL_TABLET | Freq: Four times a day (QID) | ORAL | 0 refills | Status: DC | PRN

## 2024-07-30 MED ORDER — SODIUM CHLORIDE 0.9 % IV SOLN: INTRAVENOUS | Status: DC

## 2024-07-30 MED ORDER — DOXYCYCLINE HYCLATE 100 MG PO TABS: 100.0000 mg | ORAL_TABLET | Freq: Two times a day (BID) | ORAL | 0 refills | Status: AC

## 2024-07-30 MED ORDER — CEPHALEXIN 500 MG PO CAPS: 500.0000 mg | ORAL_CAPSULE | Freq: Two times a day (BID) | ORAL | 0 refills | Status: AC

## 2024-07-30 MED ORDER — IBUPROFEN 600 MG PO TABS: ORAL_TABLET | ORAL | 0 refills | Status: DC

## 2024-07-30 NOTE — Discharge Summary (Signed)
 Physician Discharge Summary   Patient: Patricia Todd MRN: [id number] DOB: 08-01-2006  Admit date:     07/28/2024  Discharge date: 07/30/24  Discharge Physician: Eric Nunnery   PCP: Marvine Rush, MD   Recommendations at discharge:  Repeat CBC to follow WBCs trend/stability Repeat basic metabolic panel to follow electrolytes or renal function.  Discharge Diagnoses: Principal Problem:   Cellulitis Active Problems:   Sepsis without acute organ dysfunction (HCC)   Class 1 obesity GERD/GI prophylaxis.  Brief hospital admission narrative: As per H&P written by Dr. Laveda on 07/28/2024 This is a healthy 18 year old female.  The yesterday she noticed a boil under her left armpit.  She popped it with a needle yesterday.  This morning she woke she was unable to move her left arm secondary to pain under her arm.  She fevers and chills all day.  She has had no prior skin infections.  She came to the ER   Presenting vitals 142/102, 139, Tmax 99.8 Labs: Sodium 134, potassium 3.3, WBC 19.6.  Blood cultures x 2 CT chest: Inflammatory change in the left axilla.  No organized fluid collection to suggest abscess.    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 - Excellent response to IV antibiotics and anti-inflammatory drugs - Sepsis features resolved at discharge - Patient discharged home on oral antibiotics to complete therapy and instructions to keep area clean and dry. - Continue to maintain adequate hydration and continue the use of analgesics and anti-inflammatory drugs at discharge.   2-GI prophylaxis - Continue PPI.   3-hypokalemia - Electrolytes repleted and within normal limits at discharge. - Magnesium within normal limit.   4-class I obesity -Body mass index is  31.41 kg/m. -Increased physical activity, low calorie diet and portion control discussed with patient   Consultants: None Procedures performed: See below for x-ray reports. Disposition: Home Diet recommendation: Low calorie diet.  DISCHARGE MEDICATION: Allergies as of 07/30/2024       Reactions   Latex Hives, Dermatitis   Mosquito (diagnostic) Swelling, Other (See Comments)   Each bite swells into one and then causes entire extremity to swell        Medication List     STOP taking these medications    GOODY PM PO       TAKE these medications    acetaminophen  500 MG tablet Commonly known as: TYLENOL  Take 1,000 mg by mouth every 6 (six) hours as needed for mild pain (pain score 1-3) or headache.   cephALEXin  500 MG capsule Commonly known as: KEFLEX  Take 1 capsule (500 mg total) by mouth 2 (two) times daily for 8 days.   clobetasol ointment 0.05 % Commonly known as: TEMOVATE Apply 1 Application topically 2 (two) times daily.   doxycycline  100 MG tablet Commonly known as: VIBRA -TABS Take 1 tablet (100 mg total) by mouth 2 (two) times daily for 8 days.   ibuprofen  600 MG tablet Commonly known as: ADVIL  Take medications 3 times a day for 2 days; then just as needed every 8 hours for pain.   oxyCODONE  5 MG immediate release tablet Commonly known as: Oxy IR/ROXICODONE  Take 1 tablet (5 mg total) by mouth every 6 (six) hours as needed for severe pain (pain score 7-10).   pantoprazole  40 MG tablet Commonly known as: PROTONIX  Take  1 tablet (40 mg total) by mouth 2 (two) times daily.   Setlakin 0.15-0.03 MG tablet Generic drug: levonorgestrel-ethinyl estradiol Take 1 tablet by mouth daily.               Discharge Care Instructions  (From admission, onward)           Start     Ordered   07/30/24 0000  Discharge wound care:       Comments: Keep affected area clean and dry; avoid prolonged period of time of water immersion.   07/30/24 1349             Follow-up Information     Marvine Rush, MD. Schedule an appointment as soon as possible for a visit in 10 day(s).   Specialty: Family Medicine Contact information: [address] [place] [address] [phone]                Discharge Exam: Patricia Todd   07/28/24 1831  Weight: 83 kg   General exam: Alert, awake, oriented x 3; afebrile, no nausea, no vomiting and reporting significant improvement in her left arm pain. Respiratory system: Clear to auscultation. Respiratory effort normal.  Good saturation on room air. Cardiovascular system:RRR. No murmurs, rubs, gallops. Gastrointestinal system: Abdomen is nondistended, soft and nontender. No organomegaly or masses felt. Normal bowel sounds heard. Central nervous system: No focal neurological deficits. Extremities: No cyanosis or clubbing. Skin: No petechiae; improvement in patient's swelling and erythema of her left axillary area.  No active drainage appreciated. Psychiatry: Judgement and insight appear normal. Mood & affect appropriate.    Condition at discharge: Stable and improved.  The results of significant diagnostics from this hospitalization (including imaging, microbiology, ancillary and laboratory) are listed below for reference.   Imaging Studies: CT Chest W Contrast Result Date: 07/28/2024 CLINICAL DATA:  Abscess of the axilla. LEFT axillary abscess. Fever. EXAM: CT CHEST WITH CONTRAST TECHNIQUE: Multidetector CT imaging of the chest was performed during intravenous contrast administration. RADIATION DOSE REDUCTION: This exam was performed according to the departmental dose-optimization program which includes automated exposure control, adjustment of the mA and/or kV according to patient size and/or use of iterative reconstruction technique. CONTRAST:  75mL OMNIPAQUE  IOHEXOL  300 MG/ML  SOLN COMPARISON:  None Available. FINDINGS: Cardiovascular: No significant vascular findings. Normal heart size. No  pericardial effusion. Mediastinum/Nodes: No axillary or supraclavicular adenopathy. No mediastinal or hilar adenopathy. No pericardial fluid. Esophagus normal. Small amount residual thymus in the anterior mediastinum. There is fat stranding in the LEFT axilla. No organized fluid collections. Prominent lymph nodes upper limits of normal. Lungs/Pleura: No pneumonia. No pulmonary nodules. No pleural fluid. Upper Abdomen: Limited view of the liver, kidneys, pancreas are unremarkable. Normal adrenal glands. Musculoskeletal: No aggressive osseous lesion. IMPRESSION: 1. Inflammatory changes within the LEFT axilla. No organized fluid collections to suggest abscess. 2. Prominent lymph nodes in LEFT axilla presumed reactive. 3. No pneumonia. Electronically Signed   By: Jackquline Boxer M.D.   On: 07/28/2024 21:37   DG Chest Port 1 View Result Date: 07/28/2024 CLINICAL DATA:  Questionable sepsis, evaluate for abnormality EXAM: PORTABLE CHEST 1 VIEW COMPARISON:  Chest radiograph June 02, 2019 FINDINGS: The heart size and mediastinal contours are within normal limits. Both lungs are clear. The visualized skeletal structures are unremarkable. IMPRESSION: No active disease. Electronically Signed   By: Megan  Zare M.D.   On: 07/28/2024 20:06    Microbiology: Results for orders placed or performed during the hospital encounter of 07/28/24  Blood Culture (routine x 2)     Status: None (Preliminary result)   Collection Time: 07/28/24  7:19 PM   Specimen: Left Antecubital; Blood  Result Value Ref Range Status   Specimen Description LEFT ANTECUBITAL  Final   Special Requests   Final    BOTTLES DRAWN AEROBIC AND ANAEROBIC Blood Culture adequate volume   Culture   Final    NO GROWTH 2 DAYS Performed at [facility], [address], [place], [place] [address]    Report Status PENDING  Incomplete  Blood Culture (routine x 2)     Status: None (Preliminary result)   Collection Time: 07/28/24  7:24 PM   Specimen: Right  Antecubital; Blood  Result Value Ref Range Status   Specimen Description RIGHT ANTECUBITAL  Final   Special Requests   Final    BOTTLES DRAWN AEROBIC AND ANAEROBIC Blood Culture adequate volume   Culture   Final    NO GROWTH 2 DAYS Performed at [facility], [address] [place], [place] [address]    Report Status PENDING  Incomplete  Resp panel by RT-PCR (RSV, Flu A&B, Covid) Anterior Nasal Swab     Status: None   Collection Time: 07/28/24  7:40 PM   Specimen: Anterior Nasal Swab  Result Value Ref Range Status   SARS Coronavirus 2 by RT PCR NEGATIVE NEGATIVE Final    Comment: (NOTE) SARS-CoV-2 target nucleic acids are NOT DETECTED.  The SARS-CoV-2 RNA is generally detectable in upper respiratory specimens during the acute phase of infection. The lowest concentration of SARS-CoV-2 viral copies this assay can detect is 138 copies/mL. A negative result does not preclude SARS-Cov-2 infection and should not be used as the sole basis for treatment or other patient management decisions. A negative result may occur with  improper specimen collection/handling, submission of specimen other than nasopharyngeal swab, presence of viral mutation(s) within the areas targeted by this assay, and inadequate number of viral copies(<138 copies/mL). A negative result must be combined with clinical observations, patient history, and epidemiological information. The expected result is Negative.  Fact Sheet for Patients:  BloggerCourse.com  Fact Sheet for Healthcare Providers:  SeriousBroker.it  This test is no t yet approved or cleared by the [place]  FDA and  has been authorized for detection and/or diagnosis of SARS-CoV-2 by FDA under an Emergency Use Authorization (EUA). This EUA will remain  in effect (meaning this test can be used) for the duration of the COVID-19 declaration under Section 564(b)(1) of the Act, 21 U.S.C.section  360bbb-3(b)(1), unless the authorization is terminated  or revoked sooner.       Influenza A by PCR NEGATIVE NEGATIVE Final   Influenza B by PCR NEGATIVE NEGATIVE Final    Comment: (NOTE) The Xpert Xpress SARS-CoV-2/FLU/RSV plus assay is intended as an aid in the diagnosis of influenza from Nasopharyngeal swab specimens and should not be used as a sole basis for treatment. Nasal washings and aspirates are unacceptable for Xpert Xpress SARS-CoV-2/FLU/RSV testing.  Fact Sheet for Patients: BloggerCourse.com  Fact Sheet for Healthcare Providers: SeriousBroker.it  This test is not yet approved or cleared by the [place]  FDA and has been authorized for detection and/or diagnosis of SARS-CoV-2 by FDA under an Emergency Use Authorization (EUA). This EUA will remain in effect (meaning this test can be used) for the duration of the COVID-19 declaration under Section 564(b)(1) of the Act, 21 U.S.C. section 360bbb-3(b)(1), unless the authorization is terminated or revoked.     Resp Syncytial  Virus by PCR NEGATIVE NEGATIVE Final    Comment: (NOTE) Fact Sheet for Patients: BloggerCourse.com  Fact Sheet for Healthcare Providers: SeriousBroker.it  This test is not yet approved or cleared by the [place]  FDA and has been authorized for detection and/or diagnosis of SARS-CoV-2 by FDA under an Emergency Use Authorization (EUA). This EUA will remain in effect (meaning this test can be used) for the duration of the COVID-19 declaration under Section 564(b)(1) of the Act, 21 U.S.C. section 360bbb-3(b)(1), unless the authorization is terminated or revoked.  Performed at [facility], [address], [place], [place] [address]     Labs: CBC: Recent Labs  Lab 07/28/24 1919 07/29/24 0333 07/30/24 0447  WBC 19.6* 18.2* 14.3*  NEUTROABS 15.8* 13.1*  --   HGB 14.3 12.4 11.1*  HCT  42.0 37.7 34.8*  MCV 84.2 84.7 86.4  PLT 314 266 224   Basic Metabolic Panel: Recent Labs  Lab 07/28/24 1919 07/29/24 0333 07/30/24 0447  NA 134* 134* 136  K 3.3* 3.4* 4.0  CL 102 102 109  CO2 20* 24 22  GLUCOSE 104* 102* 85  BUN 8 5* 5*  CREATININE 0.70 0.65 0.57  CALCIUM 9.5 8.1* 8.1*  MG  --   --  1.6*   Liver Function Tests: Recent Labs  Lab 07/28/24 1919  AST 18  ALT 15  ALKPHOS 100  BILITOT 0.7  PROT 8.0  ALBUMIN 4.2   CBG: No results for input(s): GLUCAP in the last 168 hours.  Discharge time spent:  35 minutes.  Signed: Eric Nunnery, MD [facility] 07/30/2024

## 2024-08-02 LAB — CULTURE, BLOOD (ROUTINE X 2)
Culture: NO GROWTH
Culture: NO GROWTH
Special Requests: ADEQUATE
Special Requests: ADEQUATE

## 2024-09-12 ENCOUNTER — Encounter: Payer: Self-pay | Admitting: Adult Health

## 2024-09-12 ENCOUNTER — Ambulatory Visit (INDEPENDENT_AMBULATORY_CARE_PROVIDER_SITE_OTHER): Admitting: Adult Health

## 2024-09-12 VITALS — BP 120/84 | HR 85 | Ht 64.0 in | Wt 183.0 lb

## 2024-09-12 DIAGNOSIS — Z3202 Encounter for pregnancy test, result negative: Secondary | ICD-10-CM | POA: Diagnosis not present

## 2024-09-12 DIAGNOSIS — Z113 Encounter for screening for infections with a predominantly sexual mode of transmission: Secondary | ICD-10-CM

## 2024-09-12 DIAGNOSIS — Z1331 Encounter for screening for depression: Secondary | ICD-10-CM

## 2024-09-12 DIAGNOSIS — N926 Irregular menstruation, unspecified: Secondary | ICD-10-CM | POA: Diagnosis not present

## 2024-09-12 LAB — POCT URINE PREGNANCY: Preg Test, Ur: NEGATIVE

## 2024-09-12 NOTE — Progress Notes (Signed)
  Subjective:     Patient ID: Patricia Todd, female   DOB: December 10, 2006, 18 y.o.   MRN: [id number]  HPI Patricia Todd is a 18 year old white female,single, G0P0, in for a UPT, has missed 2 periods and had 4 + HPTs and 3 negative HPTs.  PCP is Dr Marvine  Review of Systems +missed periods Patient denies any headaches, hearing loss, fatigue, blurred vision, shortness of breath, chest pain, abdominal pain, problems with bowel movements, urination, or intercourse. No joint pain or mood swings.    Reviewed past medical,surgical, social and family history. Reviewed medications and allergies.  Objective:   Physical Exam BP 120/84 (BP Location: Right Arm, Patient Position: Sitting, Cuff Size: Normal)   Pulse 85   Ht 5' 4 (1.626 m)   Wt 183 lb (83 kg)   LMP 06/28/2024   BMI 31.41 kg/m  UPT is negative Skin warm and dry. Neck: mid line trachea, normal thyroid, good ROM, no lymphadenopathy noted. Lungs: clear to ausculation bilaterally. Cardiovascular: regular rate and rhythm.    AA is 0 Fall risk is low    09/12/2024   10:27 AM  Depression screen PHQ 2/9  Decreased Interest 0  Down, Depressed, Hopeless 0  PHQ - 2 Score 0  Altered sleeping 0  Tired, decreased energy 3  Change in appetite 3  Feeling bad or failure about yourself  3  Trouble concentrating 0  Moving slowly or fidgety/restless 0  Suicidal thoughts 0  PHQ-9 Score 9   Declines meds, says she is not depressed     09/12/2024   10:27 AM  GAD 7 : Generalized Anxiety Score  Nervous, Anxious, on Edge 3  Control/stop worrying 3  Worry too much - different things 3  Trouble relaxing 0  Restless 0  Easily annoyed or irritable 0  Afraid - awful might happen 0  Total GAD 7 Score 9    Upstream - 09/12/24 1011       Pregnancy Intention Screening   Does the patient want to become pregnant in the next year? Ok Either Way    Does the patient's partner want to become pregnant in the next year? Ok Either Way    Would the patient like  to discuss contraceptive options today? No      Contraception Wrap Up   Current Method Pregnant/Seeking Pregnancy    End Method Pregnant/Seeking Pregnancy    Contraception Counseling Provided No            Assessment:     1. Missed period (Primary) Has missed 2 periods Had 4+HPTs and 3 negative HPTs UPT is negative today Will check QHCG and Thyroid  - POCT urine pregnancy - Beta hCG quant (ref lab) - TSH + free T4  2. Negative pregnancy test  - POCT urine pregnancy - Beta hCG quant (ref lab)  3. Screening examination for STD (sexually transmitted disease) Urine sent for GC/CHL     Plan:     Follow up in 4 weeks if still no period and negative pregnancy test, can try provera to get withdrawal bleed

## 2024-09-12 NOTE — Addendum Note (Signed)
 Addended by: ILEAN RUTHERFORD HERO on: 09/12/2024 11:35 AM   Modules accepted: Orders

## 2024-09-13 ENCOUNTER — Ambulatory Visit: Payer: Self-pay | Admitting: Adult Health

## 2024-09-13 LAB — BETA HCG QUANT (REF LAB): hCG Quant: <1 m[IU]/mL

## 2024-09-13 LAB — TSH+FREE T4
Free T4: 1.1 ng/dL (ref 0.93–1.60)
TSH: 1.54 u[IU]/mL (ref 0.450–4.500)

## 2024-09-14 LAB — GC/CHLAMYDIA PROBE AMP
Chlamydia trachomatis, NAA: NEGATIVE
Neisseria Gonorrhoeae by PCR: NEGATIVE

## 2024-09-27 ENCOUNTER — Encounter (INDEPENDENT_AMBULATORY_CARE_PROVIDER_SITE_OTHER): Payer: Self-pay | Admitting: Gastroenterology

## 2024-10-30 ENCOUNTER — Encounter: Payer: Self-pay | Admitting: Adult Health

## 2024-10-30 ENCOUNTER — Ambulatory Visit (INDEPENDENT_AMBULATORY_CARE_PROVIDER_SITE_OTHER): Admitting: Adult Health

## 2024-10-30 VITALS — BP 121/88 | HR 97 | Ht 64.0 in | Wt 186.0 lb

## 2024-10-30 DIAGNOSIS — N911 Secondary amenorrhea: Secondary | ICD-10-CM

## 2024-10-30 DIAGNOSIS — Z3202 Encounter for pregnancy test, result negative: Secondary | ICD-10-CM

## 2024-10-30 DIAGNOSIS — N9489 Other specified conditions associated with female genital organs and menstrual cycle: Secondary | ICD-10-CM

## 2024-10-30 LAB — POCT URINE PREGNANCY: Preg Test, Ur: NEGATIVE

## 2024-10-30 MED ORDER — MEDROXYPROGESTERONE ACETATE 10 MG PO TABS: 10.0000 mg | ORAL_TABLET | Freq: Every day | ORAL | 0 refills | Status: DC

## 2024-10-30 NOTE — Progress Notes (Signed)
  Subjective:     Patient ID: Patricia Todd, female   DOB: 2006/01/01, 18 y.o.   MRN: [id number]  HPI Patricia Todd is a 18 year old white female,single, G0P0, back in follow up on not having a period, last period was in July. She has vaginal burning near his orgasm.  PCP is Dr Marvine  Review of Systems No period since July +vaginal burning near his orgasm Reviewed past medical,surgical, social and family history. Reviewed medications and allergies.     Objective:   Physical Exam BP 121/88 (BP Location: Left Arm, Patient Position: Sitting, Cuff Size: Normal)   Pulse 97   Ht 5' 4 (1.626 m)   Wt 186 lb (84.4 kg)   LMP 06/28/2024 (Approximate)   BMI 31.93 kg/m  UPT is negative.    Skin warm and dry.Pelvic: external genitalia is normal in appearance no lesions, vagina: white discharge without odor,urethra has no lesions or masses noted, cervix:smooth, no CMT, uterus: normal size, shape and contour, non tender, no masses felt, adnexa: no masses or tenderness noted. Bladder is non tender and no masses felt. Fall risk is low  Upstream - 10/30/24 1606       Pregnancy Intention Screening   Does the patient want to become pregnant in the next year? Ok Either Way    Does the patient's partner want to become pregnant in the next year? Ok Either Way    Would the patient like to discuss contraceptive options today? No      Contraception Wrap Up   Current Method Withdrawal or Other Method    End Method Withdrawal or Other Method    Contraception Counseling Provided No         Examination chaperoned by Clarita Salt LPN  Assessment:     1. Negative pregnancy test - POCT urine pregnancy  2. Amenorrhea, secondary (Primary) No period since July UPT is negative Will rx provera 10 mg 1 daily for 10 days Meds ordered this encounter  Medications   medroxyPROGESTERone (PROVERA) 10 MG tablet    Sig: Take 1 tablet (10 mg total) by mouth daily. X 10days    Dispense:  10 tablet    Refill:  0     Supervising Provider:   JAYNE MINDER H [[id number]]   Follow up in 3 weeks to see if gets withdrawal bleed   3. Vaginal burning, associated with sex, near his orgasm Use condoms to see if that helps, could be the semen     Plan:    GC/CHL was negative 09/12/24 Follow up in 3 weeks for ROS

## 2024-11-20 ENCOUNTER — Ambulatory Visit: Admitting: Adult Health

## 2024-11-22 ENCOUNTER — Ambulatory Visit: Admitting: Adult Health

## 2024-11-22 ENCOUNTER — Encounter: Payer: Self-pay | Admitting: Adult Health

## 2024-11-22 VITALS — BP 109/79 | HR 74 | Ht 64.0 in | Wt 185.0 lb

## 2024-11-22 DIAGNOSIS — N9489 Other specified conditions associated with female genital organs and menstrual cycle: Secondary | ICD-10-CM

## 2024-11-22 DIAGNOSIS — N911 Secondary amenorrhea: Secondary | ICD-10-CM

## 2024-11-22 NOTE — Progress Notes (Signed)
°  Subjective:     Patient ID: Patricia Todd, female   DOB: 02-19-2006, 18 y.o.   MRN: [id number]  HPI Patricia Todd is a 18 year old white female, single, G0P0, back in follow up on taking provera  to start a period and it did come on and lasted 4 days and was heavy and she used condom and did not have burning with his orgasm.   Review of Systems Got period after provera  and it lasted 4 days and was heavy No vaginal burning when used condom with his orgasm Reviewed past medical,surgical, social and family history. Reviewed medications and allergies.     Objective:   Physical Exam BP 109/79 (BP Location: Right Arm, Patient Position: Sitting, Cuff Size: Normal)   Pulse 74   Ht 5' 4 (1.626 m)   Wt 185 lb (83.9 kg)   LMP 11/01/2024 (Exact Date)   BMI 31.76 kg/m     Skin warm and dry.  Lungs: clear to ausculation bilaterally. Cardiovascular: regular rate and rhythm.  Upstream - 11/22/24 1424       Pregnancy Intention Screening   Does the patient want to become pregnant in the next year? Ok Either Way    Does the patient's partner want to become pregnant in the next year? Ok Either Way    Would the patient like to discuss contraceptive options today? No      Contraception Wrap Up   Current Method Female Condom    End Method Female Condom    Contraception Counseling Provided No           Assessment:     1. Amenorrhea, secondary (Primary) Had period for 4 days after taking provera  and it was heavy Will watch and see if has period, on her own or now   2. Vaginal burning, associated with sex, near his orgasm No burning when condom used     Plan:     Follow up prn
